# Patient Record
Sex: Female | Born: 1960 | Race: White | Hispanic: No | Smoking: Current every day smoker
Health system: Southern US, Community
[De-identification: ages and names within clinical notes are randomized; demographics above are authoritative.]

## PROBLEM LIST (undated history)

## (undated) DIAGNOSIS — K589 Irritable bowel syndrome without diarrhea: Secondary | ICD-10-CM

## (undated) DIAGNOSIS — E669 Obesity, unspecified: Secondary | ICD-10-CM

## (undated) DIAGNOSIS — K219 Gastro-esophageal reflux disease without esophagitis: Secondary | ICD-10-CM

## (undated) DIAGNOSIS — M4802 Spinal stenosis, cervical region: Secondary | ICD-10-CM

## (undated) HISTORY — DX: Irritable bowel syndrome, unspecified: K58.9

## (undated) HISTORY — DX: Gastro-esophageal reflux disease without esophagitis: K21.9

## (undated) HISTORY — DX: Obesity, unspecified: E66.9

## (undated) HISTORY — DX: Spinal stenosis, cervical region: M48.02

---

## 2004-03-09 ENCOUNTER — Ambulatory Visit: Payer: Self-pay

## 2008-04-02 ENCOUNTER — Emergency Department: Payer: Self-pay

## 2008-09-13 ENCOUNTER — Emergency Department: Payer: Self-pay | Admitting: Emergency Medicine

## 2009-05-17 ENCOUNTER — Ambulatory Visit: Payer: Self-pay | Admitting: Family Medicine

## 2010-02-27 ENCOUNTER — Ambulatory Visit: Payer: Self-pay

## 2010-03-03 ENCOUNTER — Emergency Department: Payer: Self-pay | Admitting: Emergency Medicine

## 2010-03-22 ENCOUNTER — Emergency Department: Payer: Self-pay | Admitting: Emergency Medicine

## 2010-03-28 ENCOUNTER — Emergency Department: Payer: Self-pay | Admitting: Emergency Medicine

## 2010-07-06 ENCOUNTER — Ambulatory Visit: Payer: Self-pay | Admitting: Adult Health

## 2010-11-24 ENCOUNTER — Inpatient Hospital Stay: Payer: Self-pay | Admitting: Psychiatry

## 2010-12-04 LAB — PATHOLOGY REPORT

## 2011-10-20 IMAGING — NM NUCLEAR MEDICINE HEPATOHBILIARY INCLUDE GB
2 series · 15 of 15 positions shown · non-contrast
Comparison: none

REASON FOR EXAM: ruq pain
COMMENTS:

PROCEDURE:     NM  - NM HEPATO WITH GB EJECT FRACTION  - November 27, 2010  [DATE]
RESULT:      Hepatobiliary evaluation was performed status post left wrist
injection of 8.66 mCi of Choletec.

[Series 1000: gallbladder statics · 4.80mm/px · 9 of 9 slices shown]
[im 1/9]
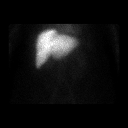
[im 2/9]
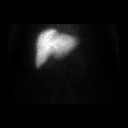
[im 3/9]
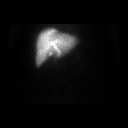
[im 4/9]
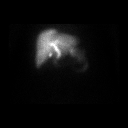
[im 5/9]
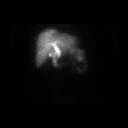
[im 6/9]
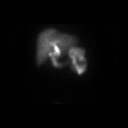
[im 7/9]
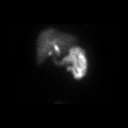
[im 8/9]
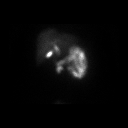
[im 9/9]
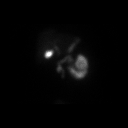

[Series 1000: gallbladder ef · 4.80mm/px · 6 of 30 frames shown]
[frame 3/30]
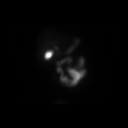
[frame 8/30]
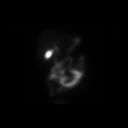
[frame 13/30]
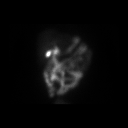
[frame 18/30]
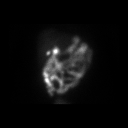
[frame 23/30]
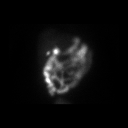
[frame 28/30]
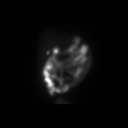

[15 of 15 positions shown; findings below may reference images not displayed]

FINDINGS: Diffuse homogeneous radiotracer activity is identified within the
liver. Intrahepatic biliary ductal activity is identified at 5 minutes and
common bile duct and early gallbladder activity at 10 minutes. Subsequently,
increased intensity is identified within the remainder of the study within
the gallbladder and common bile duct. Small bowel activity is identified at
15 minutes with increased activity appreciated throughout the remainder of
the study as well as peristalsis.

A gallbladder ejection fraction was measured status post intravenous
administration of 2.2 mcg sincalide IV over 30 minutes. The patient's
gallbladder ejection fraction was measured at 90%.
IMPRESSION: Unremarkable hepatobiliary evaluation as well as unremarkable gallbladder
ejection fraction.

## 2011-12-25 ENCOUNTER — Ambulatory Visit: Payer: Self-pay | Admitting: Adult Health

## 2012-05-27 ENCOUNTER — Emergency Department: Payer: Self-pay | Admitting: Emergency Medicine

## 2012-05-27 LAB — URINALYSIS, COMPLETE
Ketone: NEGATIVE
Specific Gravity: 1.013 (ref 1.003–1.030)
Squamous Epithelial: 5
WBC UR: 1 /HPF (ref 0–5)

## 2012-05-27 LAB — COMPREHENSIVE METABOLIC PANEL
Albumin: 4.2 g/dL (ref 3.4–5.0)
BUN: 11 mg/dL (ref 7–18)
Bilirubin,Total: 0.4 mg/dL (ref 0.2–1.0)
Calcium, Total: 8.9 mg/dL (ref 8.5–10.1)
Creatinine: 0.67 mg/dL (ref 0.60–1.30)
EGFR (Non-African Amer.): >60
Glucose: 89 mg/dL (ref 65–99)
Potassium: 4.6 mmol/L (ref 3.5–5.1)
SGOT(AST): 35 U/L (ref 15–37)
SGPT (ALT): 49 U/L (ref 12–78)
Total Protein: 7.8 g/dL (ref 6.4–8.2)

## 2012-05-27 LAB — PROTIME-INR
INR: 0.9
Prothrombin Time: 12.6 secs (ref 11.5–14.7)

## 2012-05-27 LAB — CBC
HGB: 14.7 g/dL (ref 12.0–16.0)
MCH: 30.8 pg (ref 26.0–34.0)
MCHC: 32.8 g/dL (ref 32.0–36.0)
RBC: 4.77 10*6/uL (ref 3.80–5.20)

## 2012-05-27 LAB — APTT: Activated PTT: 31.6 secs (ref 23.6–35.9)

## 2013-09-18 ENCOUNTER — Ambulatory Visit: Payer: Self-pay | Admitting: Family Medicine

## 2014-07-30 ENCOUNTER — Emergency Department: Payer: Self-pay | Admitting: Emergency Medicine

## 2014-08-18 DIAGNOSIS — S52514A Nondisplaced fracture of right radial styloid process, initial encounter for closed fracture: Secondary | ICD-10-CM | POA: Insufficient documentation

## 2014-08-27 ENCOUNTER — Other Ambulatory Visit: Payer: Self-pay | Admitting: Family Medicine

## 2014-08-27 DIAGNOSIS — Z1231 Encounter for screening mammogram for malignant neoplasm of breast: Secondary | ICD-10-CM

## 2014-10-21 ENCOUNTER — Ambulatory Visit
Admission: RE | Admit: 2014-10-21 | Discharge: 2014-10-21 | Disposition: A | Discharge status: Home / Self Care | Payer: Medicare HMO | Source: Ambulatory Visit | Attending: Family Medicine | Admitting: Family Medicine

## 2014-10-21 ENCOUNTER — Other Ambulatory Visit: Payer: Self-pay | Admitting: Family Medicine

## 2014-10-21 DIAGNOSIS — K920 Hematemesis: Secondary | ICD-10-CM | POA: Diagnosis present

## 2014-10-21 DIAGNOSIS — K921 Melena: Secondary | ICD-10-CM

## 2014-10-21 DIAGNOSIS — J209 Acute bronchitis, unspecified: Secondary | ICD-10-CM

## 2014-10-21 DIAGNOSIS — J449 Chronic obstructive pulmonary disease, unspecified: Secondary | ICD-10-CM | POA: Insufficient documentation

## 2017-02-21 ENCOUNTER — Other Ambulatory Visit: Payer: Self-pay | Admitting: Family Medicine

## 2017-02-21 DIAGNOSIS — Z1231 Encounter for screening mammogram for malignant neoplasm of breast: Secondary | ICD-10-CM

## 2017-03-26 ENCOUNTER — Ambulatory Visit: Payer: Medicare HMO | Admitting: Gastroenterology

## 2017-03-26 ENCOUNTER — Encounter: Payer: Self-pay | Admitting: Gastroenterology

## 2017-03-26 ENCOUNTER — Other Ambulatory Visit: Payer: Self-pay

## 2017-04-02 ENCOUNTER — Encounter: Payer: Self-pay | Admitting: Radiology

## 2017-04-02 ENCOUNTER — Ambulatory Visit
Admission: RE | Admit: 2017-04-02 | Discharge: 2017-04-02 | Disposition: A | Discharge status: Home / Self Care | Payer: Medicare HMO | Source: Ambulatory Visit | Attending: Family Medicine | Admitting: Family Medicine

## 2017-04-02 DIAGNOSIS — Z1231 Encounter for screening mammogram for malignant neoplasm of breast: Secondary | ICD-10-CM | POA: Insufficient documentation

## 2017-05-29 ENCOUNTER — Other Ambulatory Visit: Payer: Self-pay

## 2017-05-30 ENCOUNTER — Ambulatory Visit: Payer: Medicare HMO | Admitting: Gastroenterology

## 2017-05-30 ENCOUNTER — Encounter (INDEPENDENT_AMBULATORY_CARE_PROVIDER_SITE_OTHER): Payer: Self-pay

## 2017-05-30 ENCOUNTER — Encounter: Payer: Self-pay | Admitting: Gastroenterology

## 2017-05-30 VITALS — BP 143/90 | HR 73 | Temp 97.6°F | Ht 64.0 in | Wt 238.6 lb

## 2017-05-30 DIAGNOSIS — K219 Gastro-esophageal reflux disease without esophagitis: Secondary | ICD-10-CM

## 2017-05-30 DIAGNOSIS — M4802 Spinal stenosis, cervical region: Secondary | ICD-10-CM | POA: Diagnosis not present

## 2017-05-30 DIAGNOSIS — K580 Irritable bowel syndrome with diarrhea: Secondary | ICD-10-CM | POA: Diagnosis not present

## 2017-05-30 DIAGNOSIS — Z1211 Encounter for screening for malignant neoplasm of colon: Secondary | ICD-10-CM

## 2017-05-30 MED ORDER — DICYCLOMINE HCL 10 MG PO CAPS: 10.0000 mg | ORAL_CAPSULE | Freq: Three times a day (TID) | ORAL | 0 refills | Status: DC

## 2017-05-30 MED ORDER — RIFAXIMIN 200 MG PO TABS: 200.0000 mg | ORAL_TABLET | Freq: Three times a day (TID) | ORAL | 0 refills | Status: DC

## 2017-05-30 NOTE — Patient Instructions (Signed)
Thank you for coming to see [place] today for your gastrointestinal healthcare.  Today's care was provided by Dr. Sherri Sear and Maria Stein CMA.  During today's visit you have been advised on the following:  1. Weight Control 2. New rx Xifaxan 550mg  three times a day for 2 weeks sent to [facility]. 3. Scheduled EGD and Colonoscopy on 06/06/17 at [facility] Please review instructions. 4. Samples of IBG were provided and are available OTC to help with IBS.  Please follow up with [place] as advised.  Have a wonderful day, Sharyn Lull CMA

## 2017-05-30 NOTE — Progress Notes (Signed)
Cephas Darby, MD [address]  [place]  [place],  [address]  Main: [phone]  Fax: [phone]    Gastroenterology Consultation  Referring Provider:     Letta Median, MD Primary Care Physician:  Letta Median, MD Primary Gastroenterologist:  Dr. Cephas Darby Reason for Consultation:     IBS diarrhea        HPI:   Daisy Park is a 57 y.o. female referred by Dr. Rebeca Alert, Durene Cal, MD  for consultation & management of IBS diarrhea, discuss about surveillance colonoscopy. She has long history of symptoms of IBS-diarrhea for which she was taking peppermint preparation in the past. She had random colon biopsies at [facility] in 2007 which were normal. She is currently not on any medications. She reports having 4-5 clustered nonbloody, loose bowel movements in the morning in the span of 2-3 hour period, associated with abdominal cramps and bloating. She denies rectal bleeding or constipation, no nocturnal diarrhea, fecal incontinence or urgency. She has history of reflux for which she is on omeprazole 40 mg once a day which keeps her symptoms under control. She has history of spinal stenosis with tingling and numbness in her extremities. She was taking meloxicam 20 mg prescribed by her neurologist and she stopped it as her IBS symptoms got worse. She lost about 50 pounds few years ago and she regained weight back.  NSAIDs: None  Antiplts/Anticoagulants/Anti thrombotics: None  GI Procedures:   EGD 11/29/2010 Pathology Diagnosis:  Part A: GASTROESOPHAGEAL JUNCTION COLD BIOPSY:  - SQUAMOCOLUMNAR-JUNCTION MUCOSA WITH MILD CHRONIC INFLAMMATION.  - NO INTESTINAL METAPLASIA SEEN IN THIS SAMPLE.  Marland Kitchen  Part B: GASTRIC ANTRUM AND BODY COLD BIOPSY:  - ANTRAL MUCOSA WITH MILD REACTIVE GASTROPATHY.  - OXYNTIC MUCOSA WITHOUT PATHOLOGIC CHANGES.  - DIFF-QUIK STAIN IS NEGATIVE FOR ORGANISMS CONSISTENT WITH  HELICOBACTER PYLORI. CONTROL STAINED APPROPRIATELY.  EGD  and colonoscopy at [facility] 04/16/2006  Pathology  Diagnosis: A: Small bowel, duodenum, biopsy - Duodenal mucosa with no significant histopathologic abnormality and preservation of villous architecture  B: Colon, random biopsy - Colonic mucosa with no significant histopathologic abnormality  History reviewed. No pertinent past medical history.  History reviewed. No pertinent surgical history.   Current Outpatient Medications:  .  Albuterol Sulfate 108 (90 Base) MCG/ACT AEPB, Frequency:BID   Dosage:90   MCG  Instructions:  Note:use with spacer, 2 puffs q4 hours prn SOB, wheezing, difficulty breathing Dose: 90MCG, Disp: , Rfl:  .  citalopram (CELEXA) 40 MG tablet, , Disp: , Rfl:  .  omeprazole (PRILOSEC) 40 MG capsule, , Disp: , Rfl:  .  rifaximin (XIFAXAN) 200 MG tablet, Take 1 tablet (200 mg total) by mouth 3 (three) times daily., Disp: 42 tablet, Rfl: 0  Family History  Problem Relation Age of Onset  . Breast cancer Mother 24     Social History   Tobacco Use  . Smoking status: Current Every Day Smoker  . Smokeless tobacco: Never Used  Substance Use Topics  . Alcohol use: No    Frequency: Never  . Drug use: No    Allergies as of 05/30/2017 - never reviewed  Allergen Reaction Noted  . Chloraseptic sore throat [acetaminophen] Other (See Comments) 05/30/2017    Review of Systems:    All systems reviewed and negative except where noted in HPI.   Physical Exam:  BP (!) 143/90   Pulse 73   Temp 97.6 F (36.4 C) (Oral)   Ht 5\' 4"  (1.626  m)   Wt 238 lb 9.6 oz (108.2 kg)   BMI 40.96 kg/m  No LMP recorded. Patient is postmenopausal.  General:   Alert,  Well-developed, well-nourished, pleasant and cooperative in NAD Head:  Normocephalic and atraumatic. Eyes:  Sclera clear, no icterus.   Conjunctiva pink. Ears:  Normal auditory acuity. Nose:  No deformity, discharge, or lesions. Mouth:  No deformity or lesions,oropharynx pink & moist. Neck:  Supple; no masses or  thyromegaly. Lungs:  Respirations even and unlabored.  Clear throughout to auscultation.   No wheezes, crackles, or rhonchi. No acute distress. Heart:  Regular rate and rhythm; no murmurs, clicks, rubs, or gallops. Abdomen:  Normal bowel sounds. Soft, non-tender and non-distended without masses, hepatosplenomegaly or hernias noted.  No guarding or rebound tenderness.   Rectal: Not performed Msk:  Symmetrical without gross deformities. Good, equal movement & strength bilaterally. Pulses:  Normal pulses noted. Extremities:  No clubbing or edema.  No cyanosis. Neurologic:  Alert and oriented x3;  grossly normal neurologically. Skin:  Intact without significant lesions or rashes. No jaundice. Lymph Nodes:  No significant cervical adenopathy. Psych:  Alert and cooperative. Normal mood and affect.  Imaging Studies: Reviewed  Assessment and Plan:   Daisy Park is a 57 y.o. female with morbid obesity, with IBS-diarrhea, GERD here to establish care and discuss about surveillance colonoscopy  IBS-diarrhea: Random colon biopsies negative for microscopic colitis Trial of rifaximin 550 mg 3 times daily for 2 weeks Dicyclomine for abdominal cramps as needed Trial of IB guard  GERD: Continue omeprazole 40 mg daily, she reports flareup of symptoms after stopping medication Perform EGD  Colon cancer screening: She is overdue, discussed about indication for repeat colonoscopy  I have discussed alternative options, risks & benefits,  which include, but are not limited to, bleeding, infection, perforation,respiratory complication & drug reaction.  The patient agrees with this plan & written consent will be obtained.     Follow up in 3 months   Cephas Darby, MD

## 2017-05-31 ENCOUNTER — Other Ambulatory Visit: Payer: Self-pay

## 2017-05-31 DIAGNOSIS — Z1211 Encounter for screening for malignant neoplasm of colon: Secondary | ICD-10-CM

## 2017-05-31 DIAGNOSIS — K219 Gastro-esophageal reflux disease without esophagitis: Secondary | ICD-10-CM

## 2017-05-31 NOTE — Progress Notes (Signed)
gastr

## 2017-06-05 ENCOUNTER — Encounter: Payer: Self-pay | Admitting: *Deleted

## 2017-06-05 ENCOUNTER — Telehealth: Payer: Self-pay | Admitting: Gastroenterology

## 2017-06-05 NOTE — Telephone Encounter (Signed)
Patient needs to r/s her colonoscopy.

## 2017-06-06 ENCOUNTER — Telehealth: Payer: Self-pay | Admitting: Gastroenterology

## 2017-06-06 ENCOUNTER — Ambulatory Visit: Admission: RE | Admit: 2017-06-06 | Payer: Medicare HMO | Source: Ambulatory Visit | Admitting: Gastroenterology

## 2017-06-06 ENCOUNTER — Encounter: Admission: RE | Payer: Self-pay | Source: Ambulatory Visit

## 2017-06-06 ENCOUNTER — Other Ambulatory Visit: Payer: Self-pay

## 2017-06-06 DIAGNOSIS — K219 Gastro-esophageal reflux disease without esophagitis: Secondary | ICD-10-CM

## 2017-06-06 DIAGNOSIS — Z1211 Encounter for screening for malignant neoplasm of colon: Secondary | ICD-10-CM

## 2017-06-06 SURGERY — COLONOSCOPY WITH PROPOFOL
Anesthesia: General

## 2017-06-06 NOTE — Telephone Encounter (Signed)
Contacted patient to reschedule her colonoscopy.  She stated that [facility] did not have her rx ready for her.  She has been rescheduled to 06/20/17.

## 2017-06-06 NOTE — Telephone Encounter (Signed)
Patient LVM to call and schedule her procedures.

## 2017-06-20 ENCOUNTER — Ambulatory Visit: Payer: Medicare HMO | Admitting: Anesthesiology

## 2017-06-20 ENCOUNTER — Encounter: Payer: Self-pay | Admitting: Anesthesiology

## 2017-06-20 ENCOUNTER — Other Ambulatory Visit: Payer: Self-pay

## 2017-06-20 ENCOUNTER — Encounter
Admission: RE | Disposition: A | Discharge status: Home / Self Care | Payer: Self-pay | Source: Ambulatory Visit | Attending: Gastroenterology

## 2017-06-20 ENCOUNTER — Ambulatory Visit
Admission: RE | Admit: 2017-06-20 | Discharge: 2017-06-20 | Disposition: A | Discharge status: Home / Self Care | Payer: Medicare HMO | Source: Ambulatory Visit | Attending: Gastroenterology | Admitting: Gastroenterology

## 2017-06-20 DIAGNOSIS — Z888 Allergy status to other drugs, medicaments and biological substances status: Secondary | ICD-10-CM | POA: Insufficient documentation

## 2017-06-20 DIAGNOSIS — Z6841 Body Mass Index (BMI) 40.0 and over, adult: Secondary | ICD-10-CM | POA: Insufficient documentation

## 2017-06-20 DIAGNOSIS — K219 Gastro-esophageal reflux disease without esophagitis: Secondary | ICD-10-CM | POA: Diagnosis not present

## 2017-06-20 DIAGNOSIS — D125 Benign neoplasm of sigmoid colon: Secondary | ICD-10-CM | POA: Insufficient documentation

## 2017-06-20 DIAGNOSIS — Z1211 Encounter for screening for malignant neoplasm of colon: Secondary | ICD-10-CM | POA: Diagnosis not present

## 2017-06-20 DIAGNOSIS — D124 Benign neoplasm of descending colon: Secondary | ICD-10-CM | POA: Insufficient documentation

## 2017-06-20 DIAGNOSIS — K589 Irritable bowel syndrome without diarrhea: Secondary | ICD-10-CM | POA: Diagnosis not present

## 2017-06-20 DIAGNOSIS — K644 Residual hemorrhoidal skin tags: Secondary | ICD-10-CM | POA: Diagnosis not present

## 2017-06-20 DIAGNOSIS — R12 Heartburn: Secondary | ICD-10-CM | POA: Diagnosis not present

## 2017-06-20 DIAGNOSIS — Z79899 Other long term (current) drug therapy: Secondary | ICD-10-CM | POA: Insufficient documentation

## 2017-06-20 DIAGNOSIS — E669 Obesity, unspecified: Secondary | ICD-10-CM | POA: Diagnosis not present

## 2017-06-20 HISTORY — PX: ESOPHAGOGASTRODUODENOSCOPY (EGD) WITH PROPOFOL: SHX5813

## 2017-06-20 HISTORY — PX: COLONOSCOPY WITH PROPOFOL: SHX5780

## 2017-06-20 SURGERY — COLONOSCOPY WITH PROPOFOL
Anesthesia: General

## 2017-06-20 MED ORDER — EPHEDRINE SULFATE 50 MG/ML IJ SOLN
INTRAMUSCULAR | Status: AC
  Filled 2017-06-20: qty 1

## 2017-06-20 MED ORDER — LIDOCAINE 2% (20 MG/ML) 5 ML SYRINGE
INTRAMUSCULAR | Status: DC | PRN
  Administered 2017-06-20: 30 mg via INTRAVENOUS

## 2017-06-20 MED ORDER — SODIUM CHLORIDE 0.9 % IJ SOLN
INTRAMUSCULAR | Status: AC
  Filled 2017-06-20: qty 10

## 2017-06-20 MED ORDER — LIDOCAINE HCL (PF) 1 % IJ SOLN
INTRAMUSCULAR | Status: AC
  Administered 2017-06-20: 2 mL via INTRADERMAL
  Filled 2017-06-20: qty 2

## 2017-06-20 MED ORDER — DICYCLOMINE HCL 10 MG PO CAPS: 10.0000 mg | ORAL_CAPSULE | Freq: Three times a day (TID) | ORAL | 0 refills | Status: DC

## 2017-06-20 MED ORDER — PROPOFOL 500 MG/50ML IV EMUL
INTRAVENOUS | Status: AC
  Filled 2017-06-20: qty 50

## 2017-06-20 MED ORDER — SODIUM CHLORIDE 0.9 % IV SOLN
INTRAVENOUS | Status: DC | PRN
Start: 2017-06-20 — End: 2017-06-20
  Administered 2017-06-20: 07:00:00 via INTRAVENOUS

## 2017-06-20 MED ORDER — LIDOCAINE HCL (PF) 2 % IJ SOLN
INTRAMUSCULAR | Status: AC
  Filled 2017-06-20: qty 10

## 2017-06-20 MED ORDER — PROPOFOL 10 MG/ML IV BOLUS
INTRAVENOUS | Status: AC
  Filled 2017-06-20: qty 40

## 2017-06-20 MED ORDER — FENTANYL CITRATE (PF) 100 MCG/2ML IJ SOLN
INTRAMUSCULAR | Status: AC
  Filled 2017-06-20: qty 2

## 2017-06-20 MED ORDER — GLYCOPYRROLATE 0.2 MG/ML IJ SOLN
INTRAMUSCULAR | Status: AC
  Filled 2017-06-20: qty 1

## 2017-06-20 MED ORDER — SODIUM CHLORIDE 0.9 % IV SOLN
INTRAVENOUS | Status: DC
  Administered 2017-06-20: 1000 mL via INTRAVENOUS

## 2017-06-20 MED ORDER — PROPOFOL 10 MG/ML IV BOLUS
INTRAVENOUS | Status: DC | PRN
  Administered 2017-06-20: 100 mg via INTRAVENOUS

## 2017-06-20 MED ORDER — FENTANYL CITRATE (PF) 100 MCG/2ML IJ SOLN
25.0000 ug | INTRAMUSCULAR | Status: DC | PRN
  Administered 2017-06-20 (×2): 50 ug via INTRAVENOUS

## 2017-06-20 MED ORDER — ONDANSETRON HCL 4 MG/2ML IJ SOLN: 4.0000 mg | Freq: Once | INTRAMUSCULAR | Status: DC | PRN

## 2017-06-20 MED ORDER — LIDOCAINE HCL (PF) 1 % IJ SOLN
2.0000 mL | Freq: Once | INTRAMUSCULAR | Status: AC
  Administered 2017-06-20: 2 mL via INTRADERMAL

## 2017-06-20 MED ORDER — PROPOFOL 500 MG/50ML IV EMUL
INTRAVENOUS | Status: DC | PRN
  Administered 2017-06-20: 160 ug/kg/min via INTRAVENOUS

## 2017-06-20 MED ORDER — PHENYLEPHRINE HCL 10 MG/ML IJ SOLN
INTRAMUSCULAR | Status: DC | PRN
  Administered 2017-06-20: 100 ug via INTRAVENOUS

## 2017-06-20 NOTE — Op Note (Signed)
[facility] Patient Name: Daisy Park Procedure Date: 06/20/2017 8:25 AM MRN: [id number] Account #: [id number] Date of Birth: 08/18/1960 Admit Type: Outpatient Age: 57 Room: [facility] ENDO ROOM 3 Gender: Female Note Status: Finalized Procedure:            Colonoscopy Indications:          Screening for colorectal malignant neoplasm Providers:            Lin Landsman MD, MD Medicines:            Monitored Anesthesia Care Complications:        No immediate complications. Estimated blood loss:                        Minimal. Procedure:            Pre-Anesthesia Assessment:                       - Prior to the procedure, a History and Physical was                        performed, and patient medications and allergies were                        reviewed. The patient is competent. The risks and                        benefits of the procedure and the sedation options and                        risks were discussed with the patient. All questions                        were answered and informed consent was obtained.                        Patient identification and proposed procedure were                        verified by the physician, the nurse, the                        anesthesiologist, the anesthetist and the technician in                        the pre-procedure area in the procedure room in the                        endoscopy suite. Mental Status Examination: alert and                        oriented. Airway Examination: normal oropharyngeal                        airway and neck mobility. Respiratory Examination:                        clear to auscultation. CV Examination: normal.                        Prophylactic Antibiotics: The patient  does not require                        prophylactic antibiotics. Prior Anticoagulants: The                        patient has taken no previous anticoagulant or                        antiplatelet  agents. ASA Grade Assessment: II - A                        patient with mild systemic disease. After reviewing the                        risks and benefits, the patient was deemed in                        satisfactory condition to undergo the procedure. The                        anesthesia plan was to use monitored anesthesia care                        (MAC). Immediately prior to administration of                        medications, the patient was re-assessed for adequacy                        to receive sedatives. The heart rate, respiratory rate,                        oxygen saturations, blood pressure, adequacy of                        pulmonary ventilation, and response to care were                        monitored throughout the procedure. The physical status                        of the patient was re-assessed after the procedure.                       After obtaining informed consent, the colonoscope was                        passed under direct vision. Throughout the procedure,                        the patient's blood pressure, pulse, and oxygen                        saturations were monitored continuously. The                        Colonoscope was introduced through the anus and                        advanced to the the  cecum, identified by appendiceal                        orifice and ileocecal valve. The colonoscopy was                        performed without difficulty. The patient tolerated the                        procedure well. The quality of the bowel preparation                        was evaluated using the BBPS [facility] Bowel Preparation                        Scale) with scores of: Right Colon = 3, Transverse                        Colon = 3 and Left Colon = 3 (entire mucosa seen well                        with no residual staining, small fragments of stool or                        opaque liquid). The total BBPS score equals 9. Findings:      The  perianal exam findings include non-thrombosed external hemorrhoids       and skin tags.      Two sessile polyps were found in the sigmoid colon and descending colon.       The polyps were diminutive in size. These polyps were removed with a       cold biopsy forceps. Resection and retrieval were complete.      External hemorrhoids were found during retroflexion. The hemorrhoids       were medium-sized.      The exam was otherwise without abnormality. Impression:           - Non-thrombosed external hemorrhoids and perianal skin                        tags found on perianal exam.                       - Two diminutive polyps in the sigmoid colon and in the                        descending colon, removed with a cold biopsy forceps.                        Resected and retrieved.                       - External hemorrhoids.                       - The examination was otherwise normal. Recommendation:       - Discharge patient to home.                       - Resume previous diet today.                       -  Continue present medications.                       - Await pathology results.                       - Repeat colonoscopy in 5-10 years for surveillance                        based on pathology results.                       - Return to my office as previously scheduled. Procedure Code(s):    --- Professional ---                       [phone], Colonoscopy, flexible; with biopsy, single or                        multiple Diagnosis Code(s):    --- Professional ---                       K64.4, Residual hemorrhoidal skin tags                       Z12.11, Encounter for screening for malignant neoplasm                        of colon                       D12.5, Benign neoplasm of sigmoid colon                       D12.4, Benign neoplasm of descending colon CPT copyright 2016 American Medical Association. All rights reserved. The codes documented in this report are preliminary and upon  coder review may  be revised to meet current compliance requirements. Dr. Ulyess Mort Lin Landsman MD, MD 06/20/2017 8:49:41 AM This report has been signed electronically. Number of Addenda: 0 Note Initiated On: 06/20/2017 8:25 AM Scope Withdrawal Time: 0 hours 9 minutes 39 seconds  Total Procedure Duration: 0 hours 13 minutes 21 seconds       [facility]

## 2017-06-20 NOTE — H&P (Signed)
Cephas Darby, MD [address]  [place]  [place], [place] [address]  Main: [phone]  Fax: [phone] Pager: [phone]  Primary Care Physician:  Letta Median, MD Primary Gastroenterologist:  Dr. Cephas Darby  Pre-Procedure History & Physical: HPI:  Daisy Park is a 57 y.o. female is here for an endoscopy and colonoscopy.   Past Medical History:  Diagnosis Date  . GERD (gastroesophageal reflux disease)   . Irritable bowel syndrome   . Obesity (BMI 30-39.9)   . Spinal stenosis in cervical region     History reviewed. No pertinent surgical history.  Prior to Admission medications   Medication Sig Start Date End Date Taking? Authorizing Provider  Albuterol Sulfate 108 (90 Base) MCG/ACT AEPB Frequency:BID   Dosage:90   MCG  Instructions:  Note:use with spacer, 2 puffs q4 hours prn SOB, wheezing, difficulty breathing Dose: 90MCG 09/11/07   [provider]  citalopram (CELEXA) 40 MG tablet  01/17/17   [provider]  dicyclomine (BENTYL) 10 MG capsule Take 1 capsule (10 mg total) by mouth 4 (four) times daily -  before meals and at bedtime for 30 doses. 05/30/17 06/07/17  Lin Landsman, MD  omeprazole (PRILOSEC) 40 MG capsule  01/16/17   [provider]  rifaximin (XIFAXAN) 200 MG tablet Take 1 tablet (200 mg total) by mouth 3 (three) times daily. 05/30/17   Lin Landsman, MD    Allergies as of 06/06/2017 - never reviewed  Allergen Reaction Noted  . Chloraseptic sore throat [acetaminophen] Other (See Comments) 05/30/2017    Family History  Problem Relation Age of Onset  . Breast cancer Mother 53    Social History   Socioeconomic History  . Marital status: Divorced    Spouse name: Not on file  . Number of children: Not on file  . Years of education: Not on file  . Highest education level: Not on file  Social Needs  . Financial resource strain: Not on file  . Food insecurity - worry: Not on file  . Food  insecurity - inability: Not on file  . Transportation needs - medical: Not on file  . Transportation needs - non-medical: Not on file  Occupational History  . Not on file  Tobacco Use  . Smoking status: Current Every Day Smoker  . Smokeless tobacco: Never Used  Substance and Sexual Activity  . Alcohol use: No    Frequency: Never  . Drug use: No  . Sexual activity: Not on file  Other Topics Concern  . Not on file  Social History Narrative  . Not on file    Review of Systems: See HPI, otherwise negative ROS  Physical Exam: BP 130/88   Pulse 78   Temp (!) 96.9 F (36.1 C) (Tympanic)   Resp (!) 22   Ht 5\' 3"  (1.6 m)   Wt 230 lb (104.3 kg)   SpO2 97%   BMI 40.74 kg/m  General:   Alert,  pleasant and cooperative in NAD Head:  Normocephalic and atraumatic. Neck:  Supple; no masses or thyromegaly. Lungs:  Clear throughout to auscultation.    Heart:  Regular rate and rhythm. Abdomen:  Soft, nontender and nondistended. Normal bowel sounds, without guarding, and without rebound.   Neurologic:  Alert and  oriented x4;  grossly normal neurologically.  Impression/Plan: Daisy Park is here for an endoscopy and colonoscopy to be performed for GERD and colon cancer screening  Risks, benefits, limitations, and alternatives regarding  endoscopy and colonoscopy have been reviewed with the patient.  Questions have been answered.  All parties agreeable.   Sherri Sear, MD  06/20/2017, 8:17 AM

## 2017-06-20 NOTE — Anesthesia Preprocedure Evaluation (Signed)
Anesthesia Evaluation  Patient identified by MRN, date of birth, ID band Patient awake    Reviewed: Allergy & Precautions, NPO status , Patient's Chart, lab work & pertinent test results  Airway Mallampati: III  TM Distance: <3 FB     Dental   Pulmonary Current Smoker,  Reactive airway   Pulmonary exam normal        Cardiovascular negative cardio ROS Normal cardiovascular exam     Neuro/Psych negative neurological ROS  negative psych ROS   GI/Hepatic Neg liver ROS, GERD  Medicated,  Endo/Other  negative endocrine ROS  Renal/GU negative Renal ROS     Musculoskeletal negative musculoskeletal ROS (+)   Abdominal Normal abdominal exam  (+)   Peds negative pediatric ROS (+)  Hematology negative hematology ROS (+)   Anesthesia Other Findings Past Medical History: No date: GERD (gastroesophageal reflux disease) No date: Irritable bowel syndrome No date: Obesity (BMI 30-39.9) No date: Spinal stenosis in cervical region  Reproductive/Obstetrics                             Anesthesia Physical Anesthesia Plan  ASA: II  Anesthesia Plan: General   Post-op Pain Management:    Induction: Intravenous  PONV Risk Score and Plan:   Airway Management Planned: Nasal Cannula  Additional Equipment:   Intra-op Plan:   Post-operative Plan:   Informed Consent:   Dental advisory given  Plan Discussed with: CRNA and Surgeon  Anesthesia Plan Comments:         Anesthesia Quick Evaluation

## 2017-06-20 NOTE — Anesthesia Postprocedure Evaluation (Signed)
Anesthesia Post Note  Patient: Daisy Park  Procedure(s) Performed: COLONOSCOPY WITH PROPOFOL (N/A ) ESOPHAGOGASTRODUODENOSCOPY (EGD) WITH PROPOFOL (N/A )  Patient location during evaluation: PACU Anesthesia Type: General Level of consciousness: awake and alert and oriented Pain management: pain level controlled Vital Signs Assessment: post-procedure vital signs reviewed and stable Respiratory status: spontaneous breathing Cardiovascular status: blood pressure returned to baseline Anesthetic complications: no     Last Vitals:  Vitals:   06/20/17 0912 06/20/17 0917  BP: 117/74 116/76  Pulse: 61 64  Resp: 20   Temp:    SpO2: 97% 98%    Last Pain:  Vitals:   06/20/17 0912  TempSrc:   PainSc: 3                  Reuben Knoblock

## 2017-06-20 NOTE — Transfer of Care (Signed)
Immediate Anesthesia Transfer of Care Note  Patient: Daisy Park  Procedure(s) Performed: COLONOSCOPY WITH PROPOFOL (N/A ) ESOPHAGOGASTRODUODENOSCOPY (EGD) WITH PROPOFOL (N/A )  Patient Location: PACU and Endoscopy Unit  Anesthesia Type:General  Level of Consciousness: sedated  Airway & Oxygen Therapy: Patient Spontanous Breathing and Patient connected to nasal cannula oxygen  Post-op Assessment: Report given to RN and Post -op Vital signs reviewed and stable  Post vital signs: Reviewed and stable  Last Vitals:  Vitals:   06/20/17 0730 06/20/17 0754  BP: 130/88 130/88  Pulse: 78 78  Resp: 17 (!) 22  Temp: (!) 36.1 C (!) 36.1 C  SpO2: 97% 97%    Last Pain:  Vitals:   06/20/17 0754  TempSrc: Tympanic         Complications: No apparent anesthesia complications

## 2017-06-20 NOTE — Anesthesia Post-op Follow-up Note (Signed)
Anesthesia QCDR form completed.        

## 2017-06-20 NOTE — Op Note (Signed)
[facility] Patient Name: Daisy Park Procedure Date: 06/20/2017 8:24 AM MRN: [id number] Account #: [id number] Date of Birth: December 19, 1960 Admit Type: Outpatient Age: 57 Room: [facility] ENDO ROOM 3 Gender: Female Note Status: Finalized Procedure:            Upper GI endoscopy Indications:          Heartburn Providers:            Lin Landsman MD, MD Medicines:            Monitored Anesthesia Care Complications:        No immediate complications. Estimated blood loss: None. Procedure:            Pre-Anesthesia Assessment:                       - Prior to the procedure, a History and Physical was                        performed, and patient medications and allergies were                        reviewed. The patient is competent. The risks and                        benefits of the procedure and the sedation options and                        risks were discussed with the patient. All questions                        were answered and informed consent was obtained.                        Patient identification and proposed procedure were                        verified by the physician, the nurse, the                        anesthesiologist, the anesthetist and the technician in                        the pre-procedure area in the procedure room. Mental                        Status Examination: alert and oriented. Airway                        Examination: normal oropharyngeal airway and neck                        mobility. Respiratory Examination: clear to                        auscultation. CV Examination: normal. Prophylactic                        Antibiotics: The patient does not require prophylactic                        antibiotics. Prior  Anticoagulants: The patient has                        taken no previous anticoagulant or antiplatelet agents.                        ASA Grade Assessment: II - A patient with mild systemic        disease. After reviewing the risks and benefits, the                        patient was deemed in satisfactory condition to undergo                        the procedure. The anesthesia plan was to use monitored                        anesthesia care (MAC). Immediately prior to                        administration of medications, the patient was                        re-assessed for adequacy to receive sedatives. The                        heart rate, respiratory rate, oxygen saturations, blood                        pressure, adequacy of pulmonary ventilation, and                        response to care were monitored throughout the                        procedure. The physical status of the patient was                        re-assessed after the procedure.                       After obtaining informed consent, the endoscope was                        passed under direct vision. Throughout the procedure,                        the patient's blood pressure, pulse, and oxygen                        saturations were monitored continuously. The Endoscope                        was introduced through the mouth, and advanced to the                        second part of duodenum. The upper GI endoscopy was                        accomplished without difficulty. The patient tolerated  the procedure well. Findings:      The duodenal bulb and second portion of the duodenum were normal.      The entire examined stomach was normal.      The cardia and gastric fundus were normal on retroflexion.      The gastroesophageal junction and examined esophagus were normal. Impression:           - Normal duodenal bulb and second portion of the                        duodenum.                       - Normal stomach.                       - Normal gastroesophageal junction and esophagus.                       - No specimens collected. Recommendation:       - Follow an antireflux  regimen.                       - Use a proton pump inhibitor PO daily. Procedure Code(s):    --- Professional ---                       [id number], Esophagogastroduodenoscopy, flexible, transoral;                        diagnostic, including collection of specimen(s) by                        brushing or washing, when performed (separate procedure) Diagnosis Code(s):    --- Professional ---                       R12, Heartburn CPT copyright 2016 American Medical Association. All rights reserved. The codes documented in this report are preliminary and upon coder review may  be revised to meet current compliance requirements. Dr. Ulyess Mort Lin Landsman MD, MD 06/20/2017 8:31:57 AM This report has been signed electronically. Number of Addenda: 0 Note Initiated On: 06/20/2017 8:24 AM      [facility]

## 2017-06-21 LAB — SURGICAL PATHOLOGY

## 2017-06-24 ENCOUNTER — Encounter: Payer: Self-pay | Admitting: Gastroenterology

## 2017-07-19 ENCOUNTER — Other Ambulatory Visit: Payer: Self-pay

## 2017-07-19 ENCOUNTER — Telehealth: Payer: Self-pay | Admitting: Gastroenterology

## 2017-07-19 MED ORDER — HYDROCORTISONE 2.5 % RE CREA: 1.0000 "application " | TOPICAL_CREAM | Freq: Two times a day (BID) | RECTAL | 1 refills | Status: AC

## 2017-07-19 MED ORDER — DICYCLOMINE HCL 10 MG PO CAPS: 10.0000 mg | ORAL_CAPSULE | Freq: Three times a day (TID) | ORAL | 3 refills | Status: DC

## 2017-07-19 NOTE — Telephone Encounter (Signed)
Pt is calling she had procedure and she is still having discomfort in her stomach and tighness in anal area I scheduled pt for fu apt for 08-06-17 but she would like rx called in until apt  Please call pt

## 2017-07-22 ENCOUNTER — Telehealth: Payer: Self-pay | Admitting: Gastroenterology

## 2017-07-22 NOTE — Telephone Encounter (Signed)
Patient called and wanted to know where you called in her Dicyclomine? Please cALL

## 2017-07-22 NOTE — Telephone Encounter (Signed)
*  STAT* If patient is at the pharmacy, call can be transferred to refill team.   1. Which medications need to be refilled? (please list name of each medication and dose if known) Dicyclomin 10 mg (not sure if she ordered another medication)   2. Which pharmacy/location (including street and city if local pharmacy) is medication to be sent to? [facility]  3. Do they need a 30 day or 90 day supply? [place]

## 2017-07-22 NOTE — Telephone Encounter (Signed)
Informed pt her rx was sent to [facility].  Thanks [employer]

## 2017-08-06 ENCOUNTER — Other Ambulatory Visit
Admission: RE | Admit: 2017-08-06 | Discharge: 2017-08-06 | Disposition: A | Discharge status: Home / Self Care | Payer: Medicare HMO | Source: Ambulatory Visit | Attending: Gastroenterology | Admitting: Gastroenterology

## 2017-08-06 ENCOUNTER — Telehealth: Payer: Self-pay | Admitting: Gastroenterology

## 2017-08-06 ENCOUNTER — Encounter (INDEPENDENT_AMBULATORY_CARE_PROVIDER_SITE_OTHER): Payer: Self-pay

## 2017-08-06 ENCOUNTER — Ambulatory Visit (INDEPENDENT_AMBULATORY_CARE_PROVIDER_SITE_OTHER): Payer: Medicare HMO | Admitting: Gastroenterology

## 2017-08-06 ENCOUNTER — Encounter: Payer: Self-pay | Admitting: Gastroenterology

## 2017-08-06 VITALS — BP 137/83 | HR 66 | Temp 98.1°F | Ht 63.0 in | Wt 234.0 lb

## 2017-08-06 DIAGNOSIS — K580 Irritable bowel syndrome with diarrhea: Secondary | ICD-10-CM

## 2017-08-06 DIAGNOSIS — R61 Generalized hyperhidrosis: Secondary | ICD-10-CM

## 2017-08-06 DIAGNOSIS — J018 Other acute sinusitis: Secondary | ICD-10-CM | POA: Diagnosis not present

## 2017-08-06 DIAGNOSIS — K219 Gastro-esophageal reflux disease without esophagitis: Secondary | ICD-10-CM

## 2017-08-06 LAB — COMPREHENSIVE METABOLIC PANEL
ALK PHOS: 79 U/L (ref 38–126)
ALT: 21 U/L (ref 14–54)
ANION GAP: 10 (ref 5–15)
AST: 25 U/L (ref 15–41)
Albumin: 4.2 g/dL (ref 3.5–5.0)
BUN: 8 mg/dL (ref 6–20)
CALCIUM: 9.2 mg/dL (ref 8.9–10.3)
CO2: 22 mmol/L (ref 22–32)
Chloride: 107 mmol/L (ref 101–111)
Creatinine, Ser: 0.72 mg/dL (ref 0.44–1.00)
GFR calc Af Amer: >60 mL/min (ref >60)
GFR calc non Af Amer: >60 mL/min (ref >60)
Glucose, Bld: 89 mg/dL (ref 65–99)
Potassium: 3.8 mmol/L (ref 3.5–5.1)
SODIUM: 139 mmol/L (ref 135–145)
TOTAL PROTEIN: 7.8 g/dL (ref 6.5–8.1)
Total Bilirubin: 0.4 mg/dL (ref 0.3–1.2)

## 2017-08-06 LAB — CBC
HEMATOCRIT: 45.7 % (ref 35.0–47.0)
HEMOGLOBIN: 15.3 g/dL (ref 12.0–16.0)
MCH: 31.2 pg (ref 26.0–34.0)
MCHC: 33.5 g/dL (ref 32.0–36.0)
MCV: 93 fL (ref 80.0–100.0)
Platelets: 332 10*3/uL (ref 150–440)
RBC: 4.92 MIL/uL (ref 3.80–5.20)
RDW: 14.1 % (ref 11.5–14.5)
WBC: 9.3 10*3/uL (ref 3.6–11.0)

## 2017-08-06 LAB — TSH: TSH: 6.602 u[IU]/mL — ABNORMAL HIGH (ref 0.350–4.500)

## 2017-08-06 LAB — LIPID PANEL
CHOL/HDL RATIO: 4.4 ratio
Cholesterol: 195 mg/dL (ref 0–200)
HDL: 44 mg/dL (ref >40)
LDL CALC: 108 mg/dL — AB (ref 0–99)
TRIGLYCERIDES: 217 mg/dL — AB (ref <150)
VLDL: 43 mg/dL — ABNORMAL HIGH (ref 0–40)

## 2017-08-06 MED ORDER — AZITHROMYCIN 250 MG PO TABS: ORAL_TABLET | ORAL | 0 refills | Status: DC

## 2017-08-06 NOTE — Progress Notes (Signed)
Cephas Darby, MD [address]  [place]  [place], [place] [address]  Main: [phone]  Fax: [phone]    Gastroenterology Consultation  Referring Provider:     Letta Median, MD Primary Care Physician:  Letta Median, MD Primary Gastroenterologist:  Dr. Cephas Darby Reason for Consultation:     IBS diarrhea        HPI:   Daisy Park is a 57 y.o. female referred by Dr. Rebeca Alert, Durene Cal, MD  for consultation & management of IBS diarrhea, discuss about surveillance colonoscopy. She has long history of symptoms of IBS-diarrhea for which she was taking peppermint preparation in the past. She had random colon biopsies at [facility] in 2007 which were normal. She is currently not on any medications. She reports having 4-5 clustered nonbloody, loose bowel movements in the morning in the span of 2-3 hour period, associated with abdominal cramps and bloating. She denies rectal bleeding or constipation, no nocturnal diarrhea, fecal incontinence or urgency. She has history of reflux for which she is on omeprazole 40 mg once a day which keeps her symptoms under control. She has history of spinal stenosis with tingling and numbness in her extremities. She was taking meloxicam 20 mg prescribed by her neurologist and she stopped it as her IBS symptoms got worse. She lost about 50 pounds few years ago and she regained weight back.  Follow-up visit 07/17/2017 Since last visit, patient underwent EGD and colonoscopy which were unremarkable except for 2 tubular adenomas in the colon that were removed completely. She has completed 2 weeks course of rifaximin in this did not improve her IBS symptoms. She has tried IB guard, later switched to alternative peppermint based formulation which is helping with her IBS symptoms. She is taking Bentyl as needed. She continues to have mucous discharge after the bowel movement. She was found to have medium-sized external hemorrhoids on colonoscopy.  Today, she reports a one-year history of generalized sweating all over her body both at rest and during exertion, not associated with fever, chills or weight loss. She reports that she never had a stress test performed. She reports she had tick bites x2 with the last 5 years and wondering if her sweating is related to it. She has been menopausal since age 32, recently saw OB/GYN and underwent Pap smear. She tells me that, discussed her concern of sweating with her primary care doctor who did not recommend further evaluation. Otherwise, she denies any other GI symptoms. She is also complaining of severe chest congestion, sinus drainage has been going on for several days  NSAIDs: None  Antiplts/Anticoagulants/Anti thrombotics: None  GI Procedures:  EGD 06/20/2017 - Normal duodenal bulb and second portion of the duodenum. - Normal stomach. - Normal gastroesophageal junction and esophagus. - No specimens collected.  Colonoscopy 06/20/2017 - Non-thrombosed external hemorrhoids and perianal skin tags found on perianal exam. - Two diminutive polyps in the sigmoid colon and in the descending colon, removed with a cold biopsy forceps. Resected and retrieved. - External hemorrhoids. - The examination was otherwise normal. DIAGNOSIS:  A. COLON POLYP, DESCENDING; COLD BIOPSY:  - TUBULAR ADENOMA.  - NEGATIVE FOR HIGH-GRADE DYSPLASIA AND MALIGNANCY.   B. COLON POLYP, SIGMOID; COLD BIOPSY:  - TUBULAR ADENOMA.  - NEGATIVE FOR HIGH-GRADE DYSPLASIA AND MALIGNANCY.   EGD 11/29/2010 Pathology Diagnosis:  Part A: GASTROESOPHAGEAL JUNCTION COLD BIOPSY:  - SQUAMOCOLUMNAR-JUNCTION MUCOSA WITH MILD CHRONIC INFLAMMATION.  - NO INTESTINAL METAPLASIA SEEN IN THIS SAMPLE.  Marland Kitchen  Part B: GASTRIC ANTRUM AND BODY COLD BIOPSY:  - ANTRAL MUCOSA WITH MILD REACTIVE GASTROPATHY.  - OXYNTIC MUCOSA WITHOUT PATHOLOGIC CHANGES.  - DIFF-QUIK STAIN IS NEGATIVE FOR ORGANISMS CONSISTENT WITH  HELICOBACTER PYLORI. CONTROL  STAINED APPROPRIATELY.  EGD and colonoscopy at [facility] 04/16/2006  Pathology  Diagnosis: A: Small bowel, duodenum, biopsy - Duodenal mucosa with no significant histopathologic abnormality and preservation of villous architecture  B: Colon, random biopsy - Colonic mucosa with no significant histopathologic abnormality  Past Medical History:  Diagnosis Date  . GERD (gastroesophageal reflux disease)   . Irritable bowel syndrome   . Obesity (BMI 30-39.9)   . Spinal stenosis in cervical region     Past Surgical History:  Procedure Laterality Date  . COLONOSCOPY WITH PROPOFOL N/A 06/20/2017   Procedure: COLONOSCOPY WITH PROPOFOL;  Surgeon: Lin Landsman, MD;  Location: [facility] ENDOSCOPY;  Service: Gastroenterology;  Laterality: N/A;  . ESOPHAGOGASTRODUODENOSCOPY (EGD) WITH PROPOFOL N/A 06/20/2017   Procedure: ESOPHAGOGASTRODUODENOSCOPY (EGD) WITH PROPOFOL;  Surgeon: Lin Landsman, MD;  Location: [facility] ENDOSCOPY;  Service: Gastroenterology;  Laterality: N/A;     Current Outpatient Medications:  .  Albuterol Sulfate 108 (90 Base) MCG/ACT AEPB, Frequency:BID   Dosage:90   MCG  Instructions:  Note:use with spacer, 2 puffs q4 hours prn SOB, wheezing, difficulty breathing Dose: 90MCG, Disp: , Rfl:  .  citalopram (CELEXA) 40 MG tablet, , Disp: , Rfl:  .  hydrocortisone (ANUSOL-HC) 2.5 % rectal cream, Place 1 application rectally 2 (two) times daily., Disp: 30 g, Rfl: 1 .  omeprazole (PRILOSEC) 40 MG capsule, , Disp: , Rfl:  .  azithromycin (ZITHROMAX Z-PAK) 250 MG tablet, Take 500mg  on day 1, then 250mg  from day 2 (Patient not taking: Reported on 08/06/2017), Disp: 6 each, Rfl: 0 .  dicyclomine (BENTYL) 10 MG capsule, Take 1 capsule (10 mg total) by mouth 4 (four) times daily -  before meals and at bedtime for 120 doses. (Patient not taking: Reported on 08/06/2017), Disp: 30 capsule, Rfl: 3  Family History  Problem Relation Age of Onset  . Breast cancer Mother 54     Social History    Tobacco Use  . Smoking status: Current Every Day Smoker  . Smokeless tobacco: Never Used  Substance Use Topics  . Alcohol use: No    Frequency: Never  . Drug use: No    Allergies as of 08/06/2017 - Review Complete 08/06/2017  Allergen Reaction Noted  . Chloraseptic sore throat [acetaminophen] Other (See Comments) 05/30/2017    Review of Systems:    All systems reviewed and negative except where noted in HPI.   Physical Exam:  BP 137/83   Pulse 66   Temp 98.1 F (36.7 C) (Oral)   Ht 5\' 3"  (1.6 m)   Wt 234 lb (106.1 kg)   BMI 41.45 kg/m  No LMP recorded. Patient is postmenopausal.  General:   Alert,  Well-developed, well-nourished, pleasant and cooperative in NAD Head:  Normocephalic and atraumatic. Eyes:  Sclera clear, no icterus.   Conjunctiva pink. Ears:  Normal auditory acuity. Nose:  No deformity, discharge, or lesions. Mouth:  No deformity or lesions,oropharynx pink & moist. Neck:  Supple; no masses or thyromegaly. Lungs:  Respirations even and unlabored.  Clear throughout to auscultation.   No wheezes, crackles, or rhonchi. No acute distress. Heart:  Regular rate and rhythm; no murmurs, clicks, rubs, or gallops. Abdomen:  Normal bowel sounds. Soft, morbidly obese, non-tender and non-distended without masses, hepatosplenomegaly or hernias noted.  No guarding or rebound tenderness.   Rectal: Not performed Msk:  Symmetrical without gross deformities. Good, equal movement & strength bilaterally. Pulses:  Normal pulses noted. Extremities:  No clubbing or edema.  No cyanosis. Neurologic:  Alert and oriented x3;  grossly normal neurologically. Skin:  Intact without significant lesions or rashes. No jaundice. Lymph Nodes:  No significant cervical adenopathy. Psych:  Alert and cooperative. Normal mood and affect.  Imaging Studies: Reviewed  Assessment and Plan:   SEREEN SCHAFF is a 57 y.o. female with morbid obesity, with IBS-diarrhea, GERD here for  follow-up  IBS-diarrhea: Random colon biopsies negative for microscopic colitis Dicyclomine for abdominal cramps as needed Continue peppermint based formulation as she is doing now  GERD: EGD is unremarkable Continue omeprazole 40 mg daily, she reports flareup of symptoms after stopping medication  Colon cancer screening: 2 subcentimeter tubular adenomas , without high-grade dysplasia Recommend colonoscopy in 06/2022  Generalized sweating:  Patient requested blood work today - Check CBC, LFTs, TSH, limes titers, lipid profile, BMP - We'll send a note to her primary care doctor for further evaluation and possible stress test   Follow up in 3 months   Cephas Darby, MD

## 2017-08-06 NOTE — Telephone Encounter (Signed)
Pt notified to contact her PCP to discuss other antibiotic options due to medication reaction.

## 2017-08-06 NOTE — Telephone Encounter (Signed)
Spoke with Elta Guadeloupe and he said the interaction is with Citalopram. This is not one they can override as it can cause a serious reaction. Please recommend another medication.

## 2017-08-06 NOTE — Patient Instructions (Signed)
You have been orders labs today. Please go to [facility] outpatient lab to have these done. We will contact you when these results are back.

## 2017-08-06 NOTE — Telephone Encounter (Signed)
Please call Elta Guadeloupe at [facility] regarding [place] and another med (severe interaction) [phone]. Needs another option .

## 2017-08-07 ENCOUNTER — Telehealth: Payer: Self-pay

## 2017-08-07 LAB — LYME AB/WESTERN BLOT REFLEX: B burgdorferi Ab IgG+IgM: <0.91 {ISR} (ref 0.00–0.90)

## 2017-08-07 NOTE — Telephone Encounter (Signed)
Patient has been informed of lab results.  She has been asked to follow up with her PCP regarding TSH, and Cholesterol Elevation.  Thanks [employer]

## 2017-09-05 ENCOUNTER — Telehealth: Payer: Self-pay | Admitting: Gastroenterology

## 2017-09-05 NOTE — Telephone Encounter (Signed)
Phone number she left was  [phone]

## 2017-09-05 NOTE — Telephone Encounter (Signed)
Pt left vm for Dr. Marius Ditch she states she is still having problems with hemrroids and dirrahrea  She wants to know if she should exstend rx tryclomine please call pt

## 2017-09-10 NOTE — Telephone Encounter (Signed)
Patient stated that she will keep her appt on the 8th to come in to see you.  I offered Friday and she did appreciate it but said she can wait.  I asked her to continue with Bentyl  and to try imodium.  She said she feels pressure on the intestines and anal area-but will discuss when she comes in.  Thanks [employer]

## 2017-09-18 ENCOUNTER — Encounter: Payer: Self-pay | Admitting: Gastroenterology

## 2017-09-18 ENCOUNTER — Ambulatory Visit: Payer: Medicare HMO | Admitting: Gastroenterology

## 2017-11-06 ENCOUNTER — Other Ambulatory Visit: Payer: Self-pay

## 2017-11-06 ENCOUNTER — Encounter: Payer: Self-pay | Admitting: Gastroenterology

## 2017-11-06 ENCOUNTER — Ambulatory Visit (INDEPENDENT_AMBULATORY_CARE_PROVIDER_SITE_OTHER): Payer: Medicare HMO | Admitting: Gastroenterology

## 2017-11-06 ENCOUNTER — Encounter (INDEPENDENT_AMBULATORY_CARE_PROVIDER_SITE_OTHER): Payer: Self-pay

## 2017-11-06 VITALS — BP 111/70 | HR 90 | Resp 18 | Ht 63.0 in | Wt 231.4 lb

## 2017-11-06 DIAGNOSIS — K641 Second degree hemorrhoids: Secondary | ICD-10-CM

## 2017-11-06 DIAGNOSIS — K580 Irritable bowel syndrome with diarrhea: Secondary | ICD-10-CM | POA: Diagnosis not present

## 2017-11-06 DIAGNOSIS — K219 Gastro-esophageal reflux disease without esophagitis: Secondary | ICD-10-CM | POA: Diagnosis not present

## 2017-11-06 MED ORDER — DICYCLOMINE HCL 20 MG PO TABS: 20.0000 mg | ORAL_TABLET | Freq: Three times a day (TID) | ORAL | 0 refills | Status: DC

## 2017-11-06 NOTE — Progress Notes (Signed)
PROCEDURE NOTE: The patient presents with symptomatic grade 2 hemorrhoids, unresponsive to maximal medical therapy, requesting rubber band ligation of his/her hemorrhoidal disease.  All risks, benefits and alternative forms of therapy were described and informed consent was obtained.  In the Left Lateral Decubitus position (if anoscopy is performed) anoscopic examination revealed grade 2 hemorrhoids in the all position(s).   The decision was made to band the RA internal hemorrhoid, and the [place] was used to perform band ligation without complication.  Digital anorectal examination was then performed to assure proper positioning of the band, and to adjust the banded tissue as required.  The patient was discharged home without pain or other issues.  Dietary and behavioral recommendations were given and (if necessary - prescriptions were given), along with follow-up instructions.  The patient will return 2 weeks for follow-up and possible additional banding as required.  No complications were encountered and the patient tolerated the procedure well.  Cephas Darby, MD [address]  [place]  [place], [place] [address]  Main: [phone]  Fax: [phone] Pager: [phone]

## 2017-11-06 NOTE — Progress Notes (Signed)
Cephas Darby, MD [address]  [place]  [place], [place] [address]  Main: [phone]  Fax: [phone]    Gastroenterology Consultation  Referring Provider:     Letta Median, MD Primary Care Physician:  Letta Median, MD Primary Gastroenterologist:  Dr. Cephas Darby Reason for Consultation:     IBS diarrhea        HPI:   Daisy Park is a 57 y.o. female referred by Dr. Rebeca Alert, Durene Cal, MD  for consultation & management of IBS diarrhea, discuss about surveillance colonoscopy. She has long history of symptoms of IBS-diarrhea for which she was taking peppermint preparation in the past. She had random colon biopsies at [facility] in 2007 which were normal. She is currently not on any medications. She reports having 4-5 clustered nonbloody, loose bowel movements in the morning in the span of 2-3 hour period, associated with abdominal cramps and bloating. She denies rectal bleeding or constipation, no nocturnal diarrhea, fecal incontinence or urgency. She has history of reflux for which she is on omeprazole 40 mg once a day which keeps her symptoms under control. She has history of spinal stenosis with tingling and numbness in her extremities. She was taking meloxicam 20 mg prescribed by her neurologist and she stopped it as her IBS symptoms got worse. She lost about 50 pounds few years ago and she regained weight back.  Follow-up visit 07/17/2017 Since last visit, patient underwent EGD and colonoscopy which were unremarkable except for 2 tubular adenomas in the colon that were removed completely. She has completed 2 weeks course of rifaximin in this did not improve her IBS symptoms. She has tried IB guard, later switched to alternative peppermint based formulation which is helping with her IBS symptoms. She is taking Bentyl as needed. She continues to have mucous discharge after the bowel movement. She was found to have medium-sized external hemorrhoids on colonoscopy.  Today, she reports a one-year history of generalized sweating all over her body both at rest and during exertion, not associated with fever, chills or weight loss. She reports that she never had a stress test performed. She reports she had tick bites x2 with the last 5 years and wondering if her sweating is related to it. She has been menopausal since age 13, recently saw OB/GYN and underwent Pap smear. She tells me that, discussed her concern of sweating with her primary care doctor who did not recommend further evaluation. Otherwise, she denies any other GI symptoms. She is also complaining of severe chest congestion, sinus drainage has been going on for several days  Follow up visit 11/06/2017 She is bothered by hemorrhoidal symptoms including pressure, swelling, rectal bleeding, discomfort every time she has a bowel movement. She reports having ongoing symptoms of IBS diarrhea. She says Bentyl 20 mg helps to relieve her symptoms. She has difficulty applying Anusol cream and is requesting suppository.  NSAIDs: None  Antiplts/Anticoagulants/Anti thrombotics: None  GI Procedures:  EGD 06/20/2017 - Normal duodenal bulb and second portion of the duodenum. - Normal stomach. - Normal gastroesophageal junction and esophagus. - No specimens collected.  Colonoscopy 06/20/2017 - Non-thrombosed external hemorrhoids and perianal skin tags found on perianal exam. - Two diminutive polyps in the sigmoid colon and in the descending colon, removed with a cold biopsy forceps. Resected and retrieved. - External hemorrhoids. - The examination was otherwise normal. DIAGNOSIS:  A. COLON POLYP, DESCENDING; COLD BIOPSY:  - TUBULAR ADENOMA.  - NEGATIVE FOR HIGH-GRADE DYSPLASIA AND  MALIGNANCY.   B. COLON POLYP, SIGMOID; COLD BIOPSY:  - TUBULAR ADENOMA.  - NEGATIVE FOR HIGH-GRADE DYSPLASIA AND MALIGNANCY.   EGD 11/29/2010 Pathology Diagnosis:  Part A: GASTROESOPHAGEAL JUNCTION COLD BIOPSY:  -  SQUAMOCOLUMNAR-JUNCTION MUCOSA WITH MILD CHRONIC INFLAMMATION.  - NO INTESTINAL METAPLASIA SEEN IN THIS SAMPLE.  Marland Kitchen  Part B: GASTRIC ANTRUM AND BODY COLD BIOPSY:  - ANTRAL MUCOSA WITH MILD REACTIVE GASTROPATHY.  - OXYNTIC MUCOSA WITHOUT PATHOLOGIC CHANGES.  - DIFF-QUIK STAIN IS NEGATIVE FOR ORGANISMS CONSISTENT WITH  HELICOBACTER PYLORI. CONTROL STAINED APPROPRIATELY.  EGD and colonoscopy at [facility] 04/16/2006  Pathology  Diagnosis: A: Small bowel, duodenum, biopsy - Duodenal mucosa with no significant histopathologic abnormality and preservation of villous architecture  B: Colon, random biopsy - Colonic mucosa with no significant histopathologic abnormality  Past Medical History:  Diagnosis Date  . GERD (gastroesophageal reflux disease)   . Irritable bowel syndrome   . Obesity (BMI 30-39.9)   . Spinal stenosis in cervical region     Past Surgical History:  Procedure Laterality Date  . COLONOSCOPY WITH PROPOFOL N/A 06/20/2017   Procedure: COLONOSCOPY WITH PROPOFOL;  Surgeon: Lin Landsman, MD;  Location: [facility] ENDOSCOPY;  Service: Gastroenterology;  Laterality: N/A;  . ESOPHAGOGASTRODUODENOSCOPY (EGD) WITH PROPOFOL N/A 06/20/2017   Procedure: ESOPHAGOGASTRODUODENOSCOPY (EGD) WITH PROPOFOL;  Surgeon: Lin Landsman, MD;  Location: [facility] ENDOSCOPY;  Service: Gastroenterology;  Laterality: N/A;     Current Outpatient Medications:  .  Albuterol Sulfate 108 (90 Base) MCG/ACT AEPB, Frequency:BID   Dosage:90   MCG  Instructions:  Note:use with spacer, 2 puffs q4 hours prn SOB, wheezing, difficulty breathing Dose: 90MCG, Disp: , Rfl:  .  clonazePAM (KLONOPIN) 0.5 MG tablet, Take by mouth., Disp: , Rfl:  .  omeprazole (PRILOSEC) 40 MG capsule, , Disp: , Rfl:  .  dicyclomine (BENTYL) 20 MG tablet, Take 1 tablet (20 mg total) by mouth 4 (four) times daily -  before meals and at bedtime., Disp: 120 tablet, Rfl: 0 .  hydrocortisone (ANUSOL-HC) 2.5 % rectal cream, Place 1 application  rectally 2 (two) times daily. (Patient not taking: Reported on 11/06/2017), Disp: 30 g, Rfl: 1  Family History  Problem Relation Age of Onset  . Breast cancer Mother 46     Social History   Tobacco Use  . Smoking status: Current Every Day Smoker  . Smokeless tobacco: Never Used  Substance Use Topics  . Alcohol use: No    Frequency: Never  . Drug use: No    Allergies as of 11/06/2017 - Review Complete 11/06/2017  Allergen Reaction Noted  . Chloraseptic sore throat [acetaminophen] Other (See Comments) 05/30/2017    Review of Systems:    All systems reviewed and negative except where noted in HPI.   Physical Exam:  BP 111/70 (BP Location: Left Arm, Patient Position: Sitting, Cuff Size: Large)   Pulse 90   Resp 18   Ht 5\' 3"  (1.6 m)   Wt 231 lb 6.4 oz (105 kg)   BMI 40.99 kg/m  No LMP recorded. Patient is postmenopausal.  General:   Alert,  Well-developed, well-nourished, pleasant and cooperative in NAD Head:  Normocephalic and atraumatic. Eyes:  Sclera clear, no icterus.   Conjunctiva pink. Ears:  Normal auditory acuity. Nose:  No deformity, discharge, or lesions. Mouth:  No deformity or lesions,oropharynx pink & moist. Neck:  Supple; no masses or thyromegaly. Lungs:  Respirations even and unlabored.  Clear throughout to auscultation.   No wheezes, crackles, or rhonchi. No  acute distress. Heart:  Regular rate and rhythm; no murmurs, clicks, rubs, or gallops. Abdomen:  Normal bowel sounds. Soft, morbidly obese, non-tender and non-distended without masses, hepatosplenomegaly or hernias noted.  No guarding or rebound tenderness.   Rectal: Not performed Msk:  Symmetrical without gross deformities. Good, equal movement & strength bilaterally. Pulses:  Normal pulses noted. Extremities:  No clubbing or edema.  No cyanosis. Neurologic:  Alert and oriented x3;  grossly normal neurologically. Skin:  Intact without significant lesions or rashes. No jaundice. Lymph Nodes:  No  significant cervical adenopathy. Psych:  Alert and cooperative. Normal mood and affect.  Imaging Studies: Reviewed  Assessment and Plan:   Daisy Park is a 57 y.o. female with morbid obesity, with IBS-diarrhea, GERD, symptomatic hemorrhoids here for follow-up  IBS-diarrhea: Random colon biopsies negative for microscopic colitis Dicyclomine 20 mg every 6 hours for abdominal cramps as needed Continue peppermint based formulation as she is doing now  GERD: EGD is unremarkable Continue omeprazole 40 mg daily, she reports flareup of symptoms after stopping medication  Colon cancer screening: 2 subcentimeter tubular adenomas , without high-grade dysplasia Recommend colonoscopy in 06/2022  Symptomatic hemorrhoids: Discussed with her about outpatient hemorrhoid ligation and patient is agreeable Consent obtained Perform hemorrhoid ligation today   Follow up in 2 weeks   Cephas Darby, MD

## 2017-11-29 ENCOUNTER — Ambulatory Visit (INDEPENDENT_AMBULATORY_CARE_PROVIDER_SITE_OTHER): Payer: Medicare HMO | Admitting: Gastroenterology

## 2017-11-29 ENCOUNTER — Encounter: Payer: Self-pay | Admitting: Gastroenterology

## 2017-11-29 VITALS — BP 121/85 | HR 93 | Resp 17 | Ht 63.0 in | Wt 229.2 lb

## 2017-11-29 DIAGNOSIS — K641 Second degree hemorrhoids: Secondary | ICD-10-CM

## 2017-11-29 NOTE — Progress Notes (Signed)
PROCEDURE NOTE: The patient presents for f/u of symptomatic grade 2 hemorrhoids, unresponsive to maximal medical therapy, requesting rubber band ligation of his/her hemorrhoidal disease.  All risks, benefits and alternative forms of therapy were described and informed consent was obtained.  Her hemorrhoidal symptoms are improving  The decision was made to band the RP internal hemorrhoid, and the Yale was used to perform band ligation without complication.  Digital anorectal examination was then performed to assure proper positioning of the band, and to adjust the banded tissue as required.  The patient was discharged home without pain or other issues.  Dietary and behavioral recommendations were given and (if necessary - prescriptions were given), along with follow-up instructions.  The patient will return 2 weeks for follow-up and possible additional banding as required.  No complications were encountered and the patient tolerated the procedure well.  Cephas Darby, MD [address]  [place]  [place], [place] [address]  Main: [phone]  Fax: [phone] Pager: [phone] And andin the

## 2017-12-13 ENCOUNTER — Ambulatory Visit (INDEPENDENT_AMBULATORY_CARE_PROVIDER_SITE_OTHER): Payer: Medicare HMO | Admitting: Gastroenterology

## 2017-12-13 ENCOUNTER — Other Ambulatory Visit: Payer: Self-pay

## 2017-12-13 ENCOUNTER — Encounter: Payer: Self-pay | Admitting: Gastroenterology

## 2017-12-13 VITALS — BP 121/86 | HR 81 | Resp 17 | Ht 63.0 in | Wt 229.2 lb

## 2017-12-13 DIAGNOSIS — K641 Second degree hemorrhoids: Secondary | ICD-10-CM | POA: Diagnosis not present

## 2017-12-13 DIAGNOSIS — K580 Irritable bowel syndrome with diarrhea: Secondary | ICD-10-CM

## 2017-12-13 MED ORDER — DICYCLOMINE HCL 20 MG PO TABS: 20.0000 mg | ORAL_TABLET | Freq: Three times a day (TID) | ORAL | 0 refills | Status: AC

## 2017-12-13 NOTE — Progress Notes (Signed)
PROCEDURE NOTE: °The patient presents with symptomatic grade 2 hemorrhoids, unresponsive to maximal medical therapy, requesting rubber band ligation of his/her hemorrhoidal disease.  All risks, benefits and alternative forms of therapy were described and informed consent was obtained. ° °The decision was made to band the LL internal hemorrhoid, and the [facility] System was used to perform band ligation without complication.  Digital anorectal examination was then performed to assure proper positioning of the band, and to adjust the banded tissue as required.  The patient was discharged home without pain or other issues.  Dietary and behavioral recommendations were given and (if necessary - prescriptions were given), along with follow-up instructions.  The patient will return as needed for follow-up and possible additional banding as required. ° °No complications were encountered and the patient tolerated the procedure well. ° ° °Rohini R Vanga, MD °[address]  °[address]  °[place], [place] [address]  °Main: [phone]  °Fax: [phone] °Pager: [phone] ° ° °

## 2018-01-22 ENCOUNTER — Other Ambulatory Visit: Payer: Self-pay | Admitting: Family Medicine

## 2018-01-22 DIAGNOSIS — Z1231 Encounter for screening mammogram for malignant neoplasm of breast: Secondary | ICD-10-CM

## 2018-04-07 ENCOUNTER — Ambulatory Visit: Payer: Medicare HMO | Attending: Family Medicine

## 2019-03-24 ENCOUNTER — Other Ambulatory Visit: Payer: Self-pay

## 2019-03-24 DIAGNOSIS — Z20822 Contact with and (suspected) exposure to covid-19: Secondary | ICD-10-CM

## 2019-03-26 LAB — NOVEL CORONAVIRUS, NAA: SARS-CoV-2, NAA: NOT DETECTED

## 2019-04-06 ENCOUNTER — Telehealth: Payer: Self-pay | Admitting: Family Medicine

## 2019-04-06 NOTE — Telephone Encounter (Signed)
Patient is calling to receive her negative COVID test result. Patient expressed understanding.

## 2021-10-22 ENCOUNTER — Other Ambulatory Visit: Payer: Self-pay

## 2022-12-25 ENCOUNTER — Other Ambulatory Visit: Payer: Self-pay | Admitting: Family Medicine

## 2022-12-25 DIAGNOSIS — Z1231 Encounter for screening mammogram for malignant neoplasm of breast: Secondary | ICD-10-CM

## 2023-01-08 ENCOUNTER — Ambulatory Visit: Payer: Medicare Other

## 2023-01-08 DIAGNOSIS — K6289 Other specified diseases of anus and rectum: Secondary | ICD-10-CM | POA: Diagnosis not present

## 2023-01-08 DIAGNOSIS — D123 Benign neoplasm of transverse colon: Secondary | ICD-10-CM | POA: Diagnosis not present

## 2023-01-08 DIAGNOSIS — K642 Third degree hemorrhoids: Secondary | ICD-10-CM | POA: Diagnosis not present

## 2023-01-08 DIAGNOSIS — K6389 Other specified diseases of intestine: Secondary | ICD-10-CM | POA: Diagnosis not present

## 2023-01-08 DIAGNOSIS — Z86010 Personal history of colonic polyps: Secondary | ICD-10-CM | POA: Diagnosis not present

## 2023-01-08 DIAGNOSIS — D125 Benign neoplasm of sigmoid colon: Secondary | ICD-10-CM | POA: Diagnosis not present

## 2023-01-08 DIAGNOSIS — Z09 Encounter for follow-up examination after completed treatment for conditions other than malignant neoplasm: Secondary | ICD-10-CM | POA: Diagnosis present

## 2023-09-13 DIAGNOSIS — R197 Diarrhea, unspecified: Secondary | ICD-10-CM | POA: Diagnosis not present

## 2023-10-10 DIAGNOSIS — K219 Gastro-esophageal reflux disease without esophagitis: Secondary | ICD-10-CM | POA: Diagnosis not present

## 2023-10-10 DIAGNOSIS — K581 Irritable bowel syndrome with constipation: Secondary | ICD-10-CM | POA: Diagnosis not present

## 2023-10-10 DIAGNOSIS — E038 Other specified hypothyroidism: Secondary | ICD-10-CM | POA: Diagnosis not present

## 2023-10-10 DIAGNOSIS — M4802 Spinal stenosis, cervical region: Secondary | ICD-10-CM | POA: Diagnosis not present

## 2024-01-23 DIAGNOSIS — K581 Irritable bowel syndrome with constipation: Secondary | ICD-10-CM | POA: Diagnosis not present

## 2024-01-23 DIAGNOSIS — Z860101 Personal history of adenomatous and serrated colon polyps: Secondary | ICD-10-CM | POA: Diagnosis not present

## 2024-01-23 DIAGNOSIS — K219 Gastro-esophageal reflux disease without esophagitis: Secondary | ICD-10-CM | POA: Diagnosis not present

## 2024-01-23 DIAGNOSIS — E6609 Other obesity due to excess calories: Secondary | ICD-10-CM | POA: Diagnosis not present

## 2024-01-23 DIAGNOSIS — F418 Other specified anxiety disorders: Secondary | ICD-10-CM | POA: Diagnosis not present

## 2024-01-23 DIAGNOSIS — K5904 Chronic idiopathic constipation: Secondary | ICD-10-CM | POA: Diagnosis not present
